# Patient Record
Sex: Male | Born: 1985 | Race: Black or African American | Hispanic: No | Marital: Married | Smoking: Never smoker
Health system: Southern US, Community
[De-identification: ages and names within clinical notes are randomized; demographics above are authoritative.]

---

## 2021-06-27 ENCOUNTER — Emergency Department (INDEPENDENT_AMBULATORY_CARE_PROVIDER_SITE_OTHER)
Admission: RE | Admit: 2021-06-27 | Discharge: 2021-06-27 | Disposition: A | Discharge status: Home / Self Care | Payer: No Typology Code available for payment source | Source: Ambulatory Visit

## 2021-06-27 ENCOUNTER — Emergency Department (INDEPENDENT_AMBULATORY_CARE_PROVIDER_SITE_OTHER): Payer: No Typology Code available for payment source

## 2021-06-27 ENCOUNTER — Other Ambulatory Visit: Payer: Self-pay

## 2021-06-27 VITALS — BP 144/91 | HR 42 | Temp 98.4°F | Resp 12 | Ht 72.0 in | Wt 184.0 lb

## 2021-06-27 DIAGNOSIS — M25562 Pain in left knee: Secondary | ICD-10-CM

## 2021-06-27 DIAGNOSIS — M25462 Effusion, left knee: Secondary | ICD-10-CM

## 2021-06-27 MED ORDER — PREDNISONE 20 MG PO TABS: ORAL_TABLET | ORAL | 0 refills | Status: AC

## 2021-06-27 NOTE — Discharge Instructions (Addendum)
Advised patient to take medication to completion as directed and continue to wear right knee sleeve and still following up with orthopedic provider for further evaluation this week.  Advised patient to take medication as directed with food to completion.  Advised may continue RICE left knee 20 minutes 2-3 times daily.

## 2021-06-27 NOTE — ED Triage Notes (Signed)
Pt seen in UC w/ c/o Lt knee injury with stiffness and swelling after injury playing basketball on Thursday. Pt currently taking Ibuprofen for pain.

## 2021-06-27 NOTE — ED Provider Notes (Signed)
[facility]    CSN: [id number] Arrival date & time: 06/27/21  0940      History   Chief Complaint Chief Complaint  Patient presents with   Knee Injury    LT knee    HPI Edward Burke is a 35 y.o. male.   HPI 35 year old male presents with left knee injury, swelling,  and stiffness x 3 days playing basketball.  History reviewed. No pertinent past medical history.  There are no problems to display for this patient.   History reviewed. No pertinent surgical history.     Home Medications    Prior to Admission medications   Medication Sig Start Date End Date Taking? Authorizing Provider  ASHWAGANDHA PO Take by mouth.   Yes [provider]  ibuprofen (ADVIL) 200 MG tablet Take 200 mg by mouth every 6 (six) hours as needed.   Yes [provider]  Multiple Vitamin (MULTIVITAMIN) tablet Take 1 tablet by mouth daily.   Yes [provider]  Omega-3 Fatty Acids (FISH OIL) 1000 MG CAPS Take by mouth.   Yes [provider]  predniSONE (DELTASONE) 20 MG tablet Take 3 tabs PO daily x 3 days, then 2 tabs PO daily x 3 days, then 1 tab PO daily x 3 days 06/27/21  Yes Trevor Iha, FNP    Family History Family History  Problem Relation Age of Onset   Hypertension Mother    Diabetes Mother     Social History Social History   Tobacco Use   Smoking status: Never   Smokeless tobacco: Never  Vaping Use   Vaping Use: Never used  Substance Use Topics   Alcohol use: Not Currently   Drug use: Never     Allergies   Patient has no known allergies.   Review of Systems Review of Systems  Musculoskeletal:        Lateral knee pain x 4 days  All other systems reviewed and are negative.   Physical Exam Triage Vital Signs ED Triage Vitals  Enc Vitals Group     BP 06/27/21 1010 (!) 144/91     Pulse Rate 06/27/21 1010 (!) 42     Resp 06/27/21 1010 12     Temp 06/27/21 1010 98.4 F (36.9 C)     Temp Source 06/27/21  1010 Oral     SpO2 06/27/21 1010 99 %     Weight 06/27/21 1011 184 lb (83.5 kg)     Height 06/27/21 1011 6' (1.829 m)     Head Circumference --      Peak Flow --      Pain Score 06/27/21 1011 3     Pain Loc --      Pain Edu? --      Excl. in GC? --    No data found.  Updated Vital Signs BP (!) 144/91 (BP Location: Left Arm)   Pulse (!) 42   Temp 98.4 F (36.9 C) (Oral)   Resp 12   Ht 6' (1.829 m)   Wt 184 lb (83.5 kg)   SpO2 99%   BMI 24.95 kg/m    Physical Exam Vitals and nursing note reviewed.  Constitutional:      General: He is not in acute distress.    Appearance: Normal appearance. He is normal weight. He is not ill-appearing.  HENT:     Head: Normocephalic and atraumatic.     Mouth/Throat:     Mouth: Mucous membranes are moist.  Pharynx: Oropharynx is clear.  Eyes:     Extraocular Movements: Extraocular movements intact.     Conjunctiva/sclera: Conjunctivae normal.     Pupils: Pupils are equal, round, and reactive to light.  Cardiovascular:     Rate and Rhythm: Normal rate and regular rhythm.     Pulses: Normal pulses.     Heart sounds: Normal heart sounds.  Pulmonary:     Effort: Pulmonary effort is normal.     Breath sounds: Normal breath sounds.     Comments: No adventitious breath sounds noted Musculoskeletal:        General: Swelling, tenderness and signs of injury present.     Cervical back: Normal range of motion and neck supple.     Comments: Left knee (anterior/lateral aspect): Mild TTP over superior/lateral aspect of patella, minimal soft tissue swelling noted limited range of motion with flexion/extension, positive Lachman's, unable to evaluate drawer sign or varus/valgus manipulation due to pain  Skin:    General: Skin is warm and dry.  Neurological:     General: No focal deficit present.     Mental Status: He is alert and oriented to person, place, and time.  Psychiatric:        Mood and Affect: Mood normal.        Behavior: Behavior  normal.        Thought Content: Thought content normal.     UC Treatments / Results  Labs (all labs ordered are listed, but only abnormal results are displayed) Labs Reviewed - No data to display  EKG   Radiology DG Knee Complete 4 Views Left  Result Date: 06/27/2021 CLINICAL DATA:  Posterior left knee pain.  Recent injuries. EXAM: LEFT KNEE - COMPLETE 4+ VIEW COMPARISON:  None. FINDINGS: There are small bone fragments adjacent to the lower pole of the patella measuring up to 3 mm in size with associated soft tissue swelling and patellar tendon thickening. Mild spurring is noted at the upper and lower poles of the patella. There is a large knee joint effusion. The distal femur and proximal tibia and fibula appear intact. There is slight medial compartment joint space narrowing. IMPRESSION: 1. Small bone fragments adjacent to the lower pole of the patella with surrounding soft tissue swelling potentially indicative of an avulsion fracture. 2. Large knee joint effusion. Electronically Signed   By: Sebastian Ache M.D.   On: 06/27/2021 12:26    Procedures Procedures (including critical care time)  Medications Ordered in UC Medications - No data to display  Initial Impression / Assessment and Plan / UC Course  I have reviewed the triage vital signs and the nursing notes.  Pertinent labs & imaging results that were available during my care of the patient were reviewed by me and considered in my medical decision making (see chart for details).    MDM: 1.  Acute left knee pain-Rx'd prednisone taper and advised orthopedic follow-up with [facility] provider provided.  [facility] [place] and get advanced imaging from that PCP (most probably MRI of left knee), 2. Effusion of left knee-advised patient to RICE left knee for 20 minutes 2-3 times daily for the next 3 to 5 days.  Patient discharged home, hemodynamically stable. Final Clinical Impressions(s) / UC Diagnoses    Final diagnoses:  Acute pain of left knee  Effusion of left knee     Discharge Instructions      Advised patient to take medication to completion as directed  and continue to wear right knee sleeve and still following up with orthopedic provider for further evaluation this week.  Advised patient to take medication as directed with food to completion.  Advised may continue RICE left knee 20 minutes 2-3 times daily.     ED Prescriptions     Medication Sig Dispense Auth. Provider   predniSONE (DELTASONE) 20 MG tablet Take 3 tabs PO daily x 3 days, then 2 tabs PO daily x 3 days, then 1 tab PO daily x 3 days 18 tablet Trevor Iha, FNP      PDMP not reviewed this encounter.   Trevor Iha, FNP 06/27/21 1307

## 2022-12-14 IMAGING — DX DG KNEE COMPLETE 4+V*L*
4 series · 4 of 4 positions shown · non-contrast
Comparison: None.

CLINICAL DATA: Posterior left knee pain.  Recent injuries.

EXAM:
LEFT KNEE - COMPLETE 4+ VIEW

[knee ap]
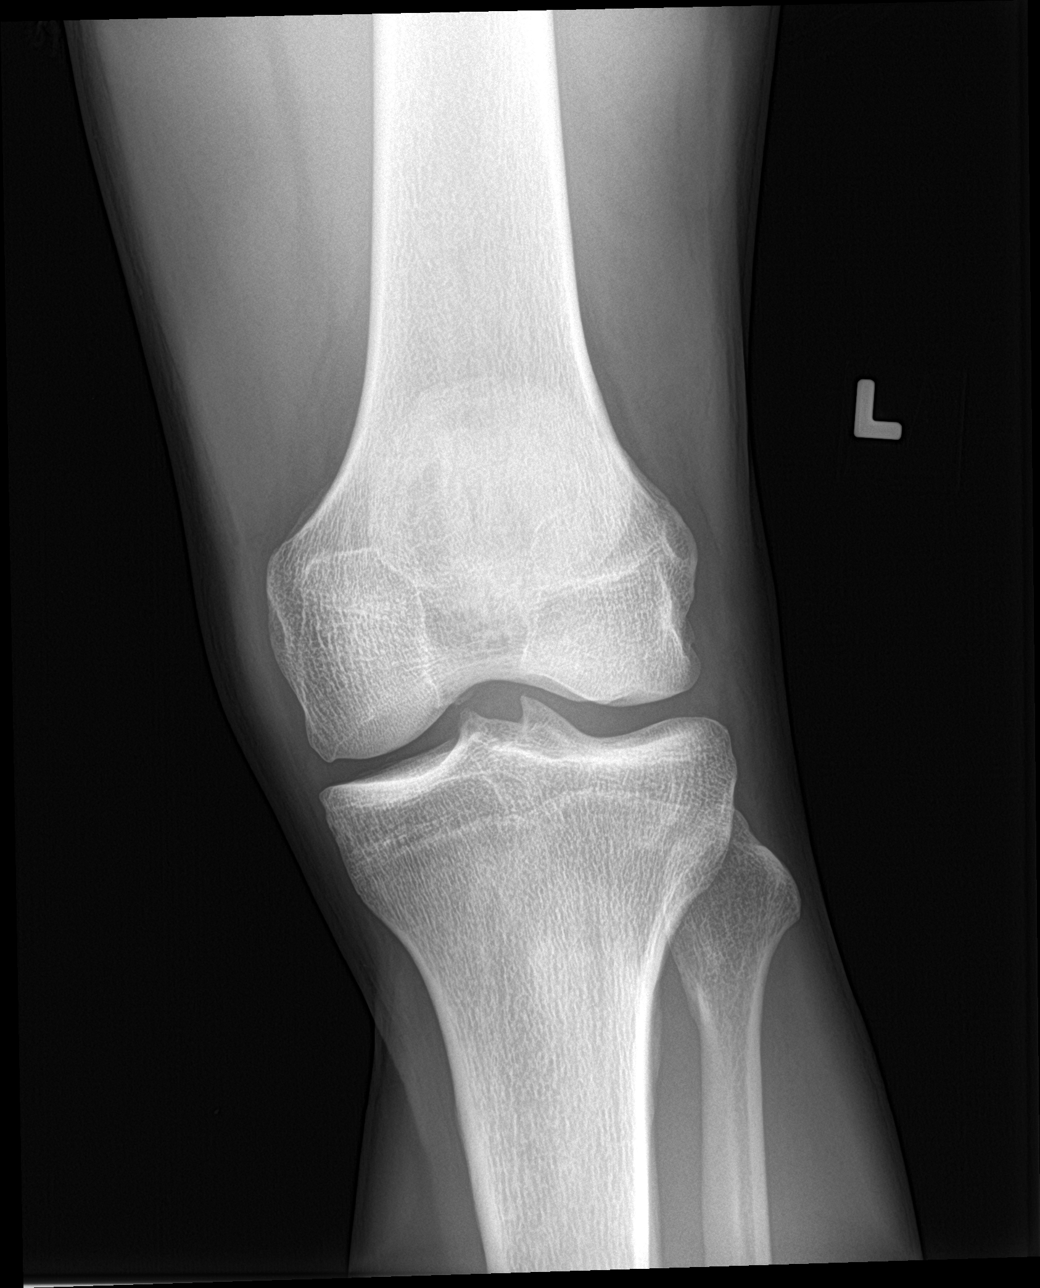

[knee lat]
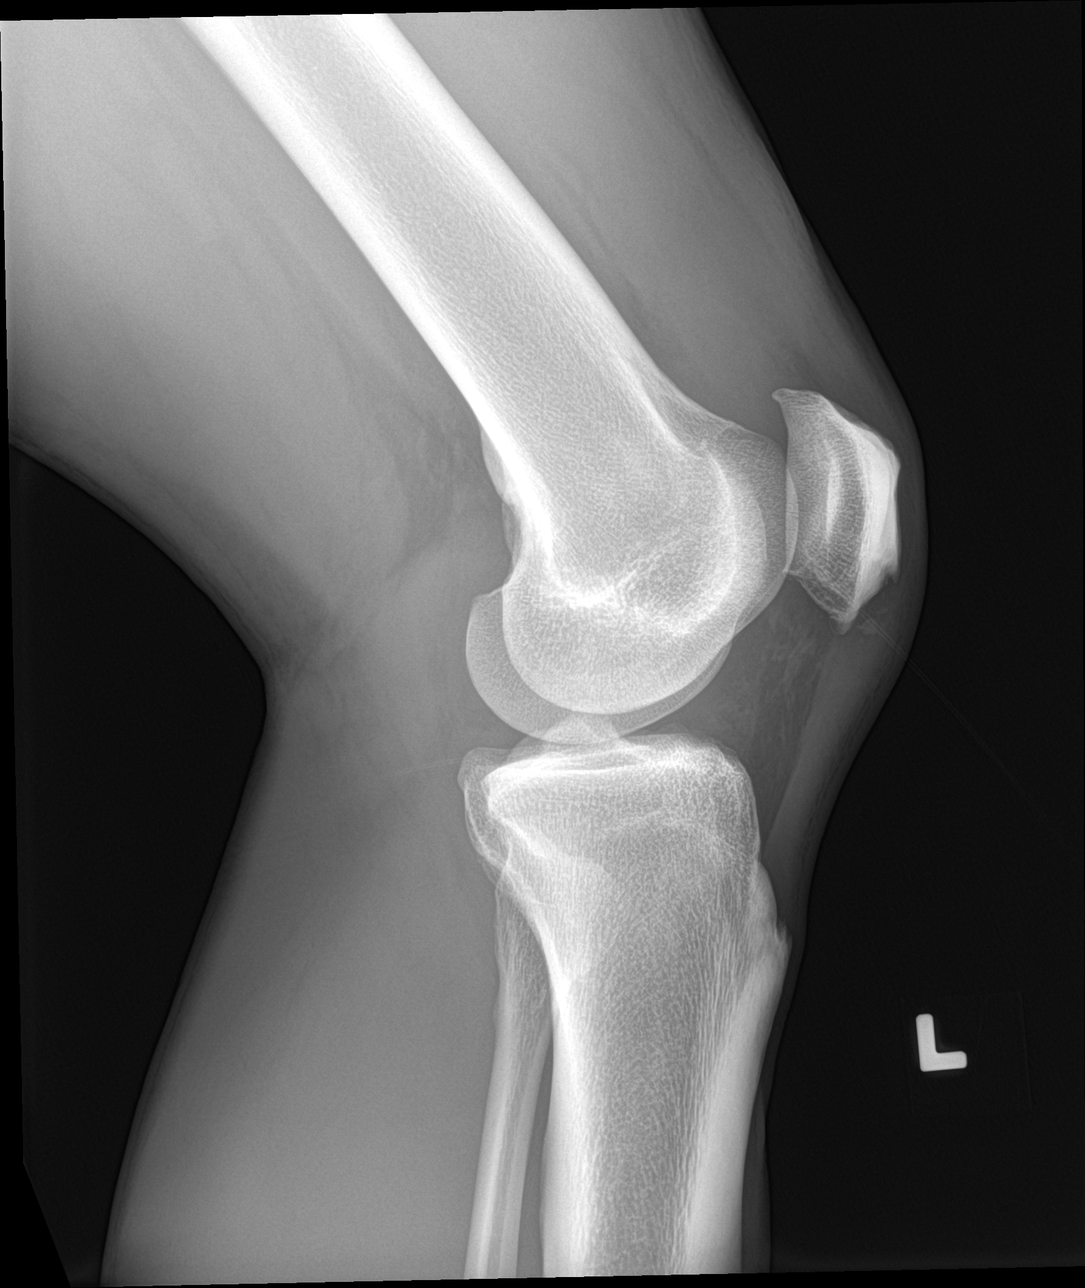

[knee obl (1 of 2)]
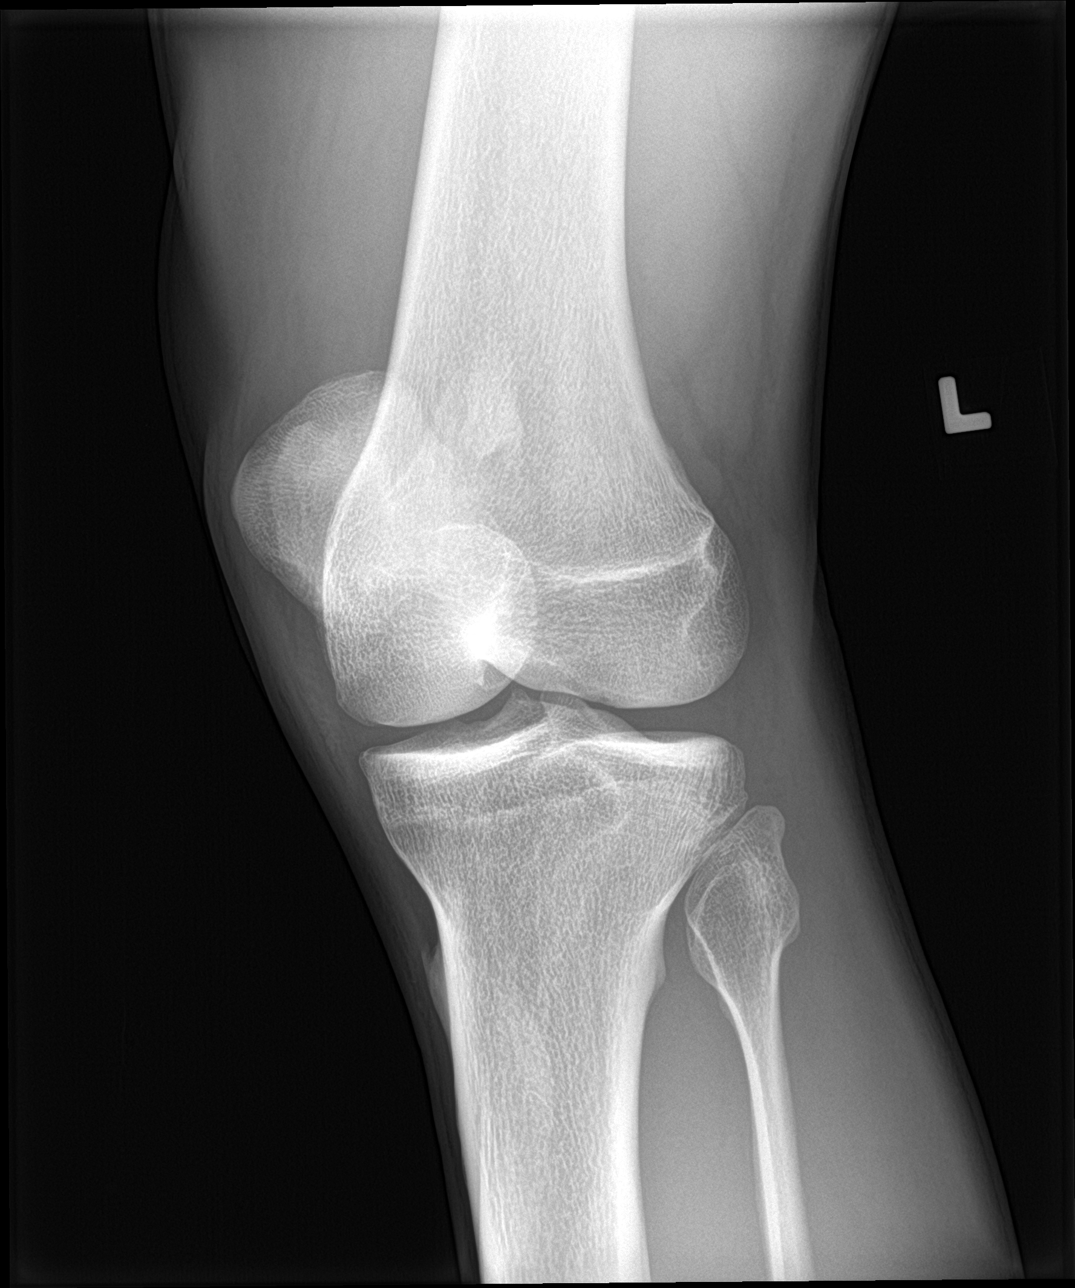

[knee obl (2 of 2)]
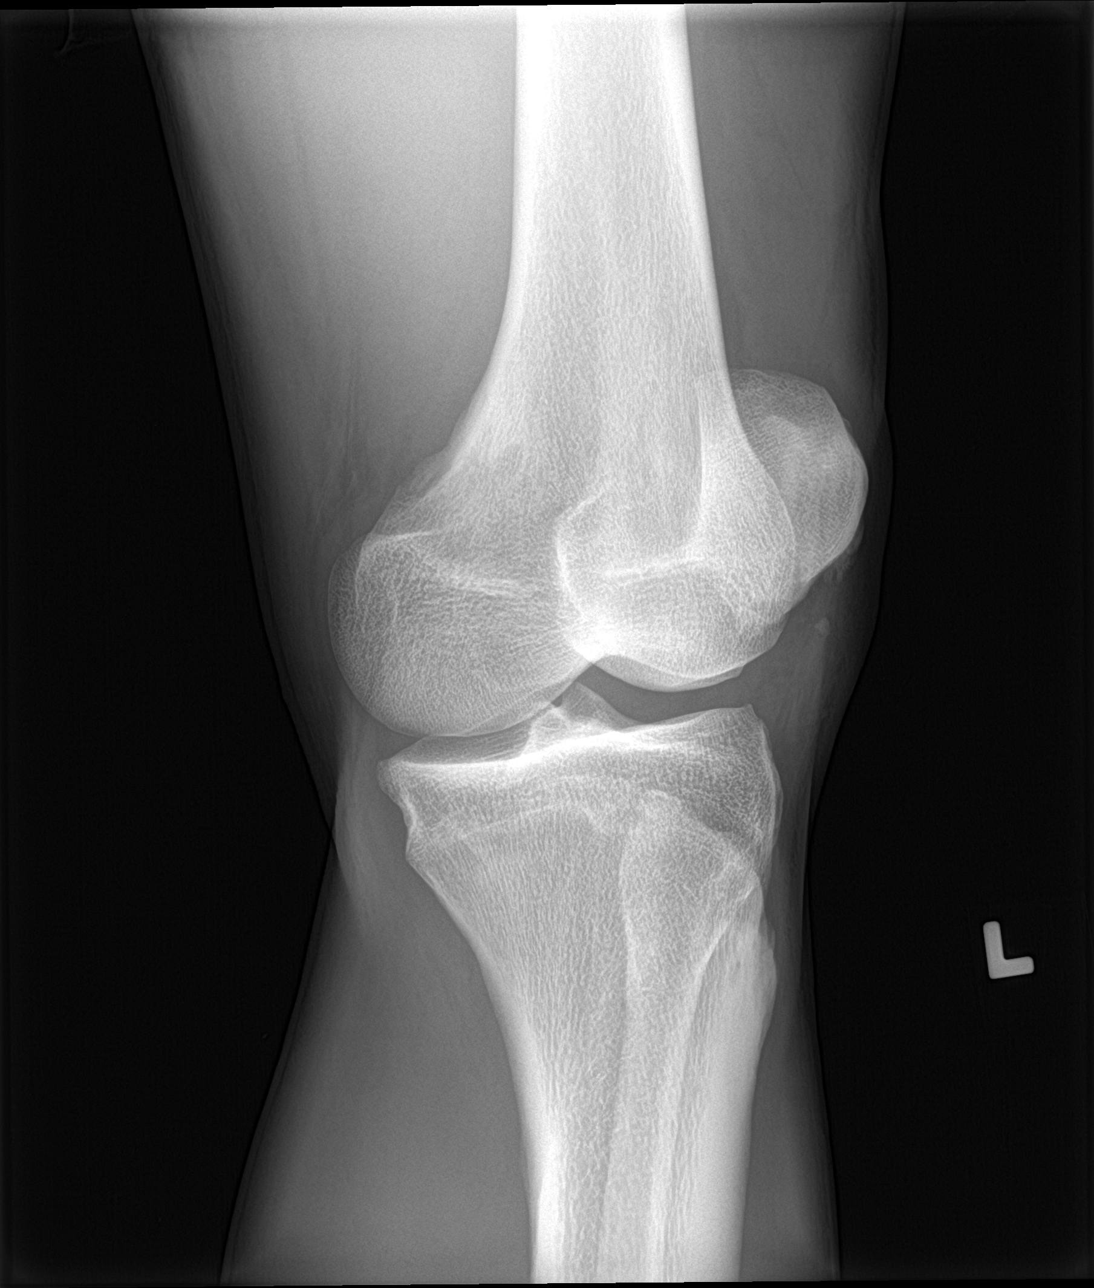

[4 of 4 positions shown; findings below may reference images not displayed]

FINDINGS: There are small bone fragments adjacent to the lower pole of the
patella measuring up to 3 mm in size with associated soft tissue
swelling and patellar tendon thickening. Mild spurring is noted at
the upper and lower poles of the patella. There is a large knee
joint effusion. The distal femur and proximal tibia and fibula
appear intact. There is slight medial compartment joint space
narrowing.
IMPRESSION: 1. Small bone fragments adjacent to the lower pole of the patella
with surrounding soft tissue swelling potentially indicative of an
avulsion fracture.
2. Large knee joint effusion.
